# Patient Record
Sex: Female | Born: 1950 | Race: White | Hispanic: No | Marital: Married | Smoking: Never smoker
Health system: Southern US, Community
[De-identification: ages and names within clinical notes are randomized; demographics above are authoritative.]

## PROBLEM LIST (undated history)

## (undated) DIAGNOSIS — S060X9A Concussion with loss of consciousness of unspecified duration, initial encounter: Secondary | ICD-10-CM

## (undated) DIAGNOSIS — G43909 Migraine, unspecified, not intractable, without status migrainosus: Secondary | ICD-10-CM

## (undated) DIAGNOSIS — M797 Fibromyalgia: Secondary | ICD-10-CM

## (undated) DIAGNOSIS — S060XAA Concussion with loss of consciousness status unknown, initial encounter: Secondary | ICD-10-CM

## (undated) DIAGNOSIS — E079 Disorder of thyroid, unspecified: Secondary | ICD-10-CM

## (undated) HISTORY — DX: Concussion with loss of consciousness status unknown, initial encounter: S06.0XAA

## (undated) HISTORY — DX: Concussion with loss of consciousness of unspecified duration, initial encounter: S06.0X9A

---

## 2008-09-21 ENCOUNTER — Ambulatory Visit: Payer: Self-pay | Admitting: *Deleted

## 2009-01-03 ENCOUNTER — Ambulatory Visit: Payer: Self-pay | Admitting: Vascular Surgery

## 2009-02-22 ENCOUNTER — Emergency Department (HOSPITAL_BASED_OUTPATIENT_CLINIC_OR_DEPARTMENT_OTHER): Admission: EM | Admit: 2009-02-22 | Discharge: 2009-02-23 | Payer: Self-pay | Admitting: Emergency Medicine

## 2009-02-23 ENCOUNTER — Ambulatory Visit: Payer: Self-pay | Admitting: Diagnostic Radiology

## 2011-01-07 LAB — BASIC METABOLIC PANEL
BUN: 19 mg/dL (ref 6–23)
CO2: 31 mEq/L (ref 19–32)
Calcium: 9.4 mg/dL (ref 8.4–10.5)
Chloride: 103 mEq/L (ref 96–112)
Creatinine, Ser: 0.7 mg/dL (ref 0.4–1.2)
GFR calc Af Amer: >60 mL/min (ref >60)
GFR calc non Af Amer: >60 mL/min (ref >60)
Glucose, Bld: 81 mg/dL (ref 70–99)
Potassium: 3.8 mEq/L (ref 3.5–5.1)
Sodium: 143 mEq/L (ref 135–145)

## 2011-01-07 LAB — DIFFERENTIAL
Basophils Absolute: 0 10*3/uL (ref 0.0–0.1)
Basophils Relative: 1 % (ref 0–1)
Monocytes Absolute: 0.9 10*3/uL (ref 0.1–1.0)
Neutro Abs: 3.2 10*3/uL (ref 1.7–7.7)
Neutrophils Relative %: 41 % — ABNORMAL LOW (ref 43–77)

## 2011-01-07 LAB — CBC
MCHC: 35 g/dL (ref 30.0–36.0)
Platelets: 197 10*3/uL (ref 150–400)
RDW: 12.1 % (ref 11.5–15.5)

## 2011-02-11 NOTE — Procedures (Signed)
LOWER EXTREMITY VENOUS REFLUX EXAM   INDICATION:  Varicose veins with pain and swelling.   EXAM:  Using color-flow imaging and pulse Doppler spectral analysis, the  right and left common femoral, superficial femoral, popliteal, posterior  tibial, greater and lesser saphenous veins are evaluated.  There is  evidence suggesting deep venous insufficiency in the right and left  lower extremity common femoral veins.   The right saphenofemoral junction is not competent.  The right GSV is  not competent with the caliber as described below.  The left greater  saphenous vein is competent.   The right and left proximal short saphenous veins demonstrate  competency.   GSV Diameter (used if found to be incompetent only)                                            Right    Left  Proximal Greater Saphenous Vein           0.55 cm  cm  Proximal-to-mid-thigh                     cm       cm  Mid thigh                                 0.31 cm  cm  Mid-distal thigh                          cm       cm  Distal thigh                              0.45 cm  cm  Knee                                      0.35 cm  cm   IMPRESSION:  1. Right greater saphenous vein reflux is identified with the caliber      ranging from 0.31 cm to 0.55 cm knee to groin.  2. The right and left greater saphenous veins are not aneurysmal.  3. The right and left greater saphenous veins are not tortuous.  4. The deep venous system is not competent in the common femoral      veins.  5. The right and left lesser saphenous veins are competent.       ___________________________________________  Larina Earthly, M.D.   AS/MEDQ  D:  09/21/2008  T:  09/21/2008  Job:  [id number]

## 2011-02-11 NOTE — Assessment & Plan Note (Signed)
OFFICE VISIT   MIRIELLE, BYRUM  DOB:  Nov 01, 1950                                       01/03/2009  EAVWU#:[id number]   The patient presents today for continued discussion regarding her venous  pathology.  She is a very pleasant 60 year old white female who had  initially seen Dr. Madilyn Fireman for consultation regarding venous pathology on  09/21/2008.  She had several episodes of bleeding around small varices  around her ankle in November of 2009.  She also reported pain and  discomfort over tributary varicosities in her right calf.  She has  scattered spider vein telangiectasia bilaterally.  She does use  elevation when possible but has not worn graduated compression garments.  She has not had any further bleeding since November.  Her venous duplex  at the time of her initial visit on 09/21/2009 revealed incompetence in  her right great saphenous vein.  On physical exam, she does continue to  have varicosities in her calf and she does report tenderness overlying  these.  I discussed the options with Ms. Whitsitt.  I have recommended that  we fit her with graduated compression garments to see if we can improve  the symptoms of her pain and swelling.  I did explain the cause of her  bleeding from her ankle was due to the venous hypertension.  I did  discuss the options of laser ablation and stab phlebectomy for  improvement of symptoms as well.  She understands this and we will see  her again in 3 months for continued discussion.   Larina Earthly, M.D.  Electronically Signed   TFE/MEDQ  D:  01/03/2009  T:  01/04/2009  Job:  [id number]
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
## 2012-09-07 ENCOUNTER — Encounter (INDEPENDENT_AMBULATORY_CARE_PROVIDER_SITE_OTHER): Payer: Self-pay

## 2012-09-07 DIAGNOSIS — I83893 Varicose veins of bilateral lower extremities with other complications: Secondary | ICD-10-CM

## 2015-02-22 ENCOUNTER — Emergency Department (HOSPITAL_BASED_OUTPATIENT_CLINIC_OR_DEPARTMENT_OTHER)
Admission: EM | Admit: 2015-02-22 | Discharge: 2015-02-22 | Disposition: A | Discharge status: Home / Self Care | Payer: Medicare Other | Attending: Emergency Medicine | Admitting: Emergency Medicine

## 2015-02-22 ENCOUNTER — Encounter (HOSPITAL_BASED_OUTPATIENT_CLINIC_OR_DEPARTMENT_OTHER): Payer: Self-pay | Admitting: *Deleted

## 2015-02-22 ENCOUNTER — Emergency Department (HOSPITAL_BASED_OUTPATIENT_CLINIC_OR_DEPARTMENT_OTHER): Payer: Medicare Other

## 2015-02-22 DIAGNOSIS — S8001XA Contusion of right knee, initial encounter: Secondary | ICD-10-CM

## 2015-02-22 DIAGNOSIS — S161XXA Strain of muscle, fascia and tendon at neck level, initial encounter: Secondary | ICD-10-CM | POA: Diagnosis not present

## 2015-02-22 DIAGNOSIS — S79912A Unspecified injury of left hip, initial encounter: Secondary | ICD-10-CM | POA: Diagnosis not present

## 2015-02-22 DIAGNOSIS — S8002XA Contusion of left knee, initial encounter: Secondary | ICD-10-CM | POA: Diagnosis not present

## 2015-02-22 DIAGNOSIS — Y92410 Unspecified street and highway as the place of occurrence of the external cause: Secondary | ICD-10-CM | POA: Diagnosis not present

## 2015-02-22 DIAGNOSIS — Y998 Other external cause status: Secondary | ICD-10-CM | POA: Diagnosis not present

## 2015-02-22 DIAGNOSIS — S79911A Unspecified injury of right hip, initial encounter: Secondary | ICD-10-CM | POA: Insufficient documentation

## 2015-02-22 DIAGNOSIS — Y9389 Activity, other specified: Secondary | ICD-10-CM | POA: Insufficient documentation

## 2015-02-22 DIAGNOSIS — S0990XA Unspecified injury of head, initial encounter: Secondary | ICD-10-CM | POA: Diagnosis not present

## 2015-02-22 DIAGNOSIS — S8991XA Unspecified injury of right lower leg, initial encounter: Secondary | ICD-10-CM | POA: Diagnosis present

## 2015-02-22 HISTORY — DX: Fibromyalgia: M79.7

## 2015-02-22 HISTORY — DX: Disorder of thyroid, unspecified: E07.9

## 2015-02-22 HISTORY — DX: Migraine, unspecified, not intractable, without status migrainosus: G43.909

## 2015-02-22 MED ORDER — HYDROCODONE-ACETAMINOPHEN 5-325 MG PO TABS: 1.0000 | ORAL_TABLET | Freq: Four times a day (QID) | ORAL | Status: AC | PRN
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
## 2015-02-22 NOTE — Discharge Instructions (Signed)
Hydrocodone as prescribed as needed for pain.  Apply ice to the painful areas 20 minutes at a time every 2 hours while awake for the next 2 days.  Return to the ER if you experience any new or worsening symptoms.   Cervical Sprain A cervical sprain is an injury in the neck in which the strong, fibrous tissues (ligaments) that connect your neck bones stretch or tear. Cervical sprains can range from mild to severe. Severe cervical sprains can cause the neck vertebrae to be unstable. This can lead to damage of the spinal cord and can result in serious nervous system problems. The amount of time it takes for a cervical sprain to get better depends on the cause and extent of the injury. Most cervical sprains heal in 1 to 3 weeks. CAUSES  Severe cervical sprains may be caused by:   Contact sport injuries (such as from football, rugby, wrestling, hockey, auto racing, gymnastics, diving, martial arts, or boxing).   Motor vehicle collisions.   Whiplash injuries. This is an injury from a sudden forward and backward whipping movement of the head and neck.  Falls.  Mild cervical sprains may be caused by:   Being in an awkward position, such as while cradling a telephone between your ear and shoulder.   Sitting in a chair that does not offer proper support.   Working at a poorly Marketing executive station.   Looking up or down for long periods of time.  SYMPTOMS   Pain, soreness, stiffness, or a burning sensation in the front, back, or sides of the neck. This discomfort may develop immediately after the injury or slowly, 24 hours or more after the injury.   Pain or tenderness directly in the middle of the back of the neck.   Shoulder or upper back pain.   Limited ability to move the neck.   Headache.   Dizziness.   Weakness, numbness, or tingling in the hands or arms.   Muscle spasms.   Difficulty swallowing or chewing.   Tenderness and swelling of the neck.   DIAGNOSIS  Most of the time your health care provider can diagnose a cervical sprain by taking your history and doing a physical exam. Your health care provider will ask about previous neck injuries and any known neck problems, such as arthritis in the neck. X-rays may be taken to find out if there are any other problems, such as with the bones of the neck. Other tests, such as a CT scan or MRI, may also be needed.  TREATMENT  Treatment depends on the severity of the cervical sprain. Mild sprains can be treated with rest, keeping the neck in place (immobilization), and pain medicines. Severe cervical sprains are immediately immobilized. Further treatment is done to help with pain, muscle spasms, and other symptoms and may include:  Medicines, such as pain relievers, numbing medicines, or muscle relaxants.   Physical therapy. This may involve stretching exercises, strengthening exercises, and posture training. Exercises and improved posture can help stabilize the neck, strengthen muscles, and help stop symptoms from returning.  HOME CARE INSTRUCTIONS   Put ice on the injured area.   Put ice in a plastic bag.   Place a towel between your skin and the bag.   Leave the ice on for 15-20 minutes, 3-4 times a day.   If your injury was severe, you may have been given a cervical collar to wear. A cervical collar is a two-piece collar designed to keep your neck  from moving while it heals.  Do not remove the collar unless instructed by your health care provider.  If you have long hair, keep it outside of the collar.  Ask your health care provider before making any adjustments to your collar. Minor adjustments may be required over time to improve comfort and reduce pressure on your chin or on the back of your head.  Ifyou are allowed to remove the collar for cleaning or bathing, follow your health care provider's instructions on how to do so safely.  Keep your collar clean by wiping it with  mild soap and water and drying it completely. If the collar you have been given includes removable pads, remove them every 1-2 days and hand wash them with soap and water. Allow them to air dry. They should be completely dry before you wear them in the collar.  If you are allowed to remove the collar for cleaning and bathing, wash and dry the skin of your neck. Check your skin for irritation or sores. If you see any, tell your health care provider.  Do not drive while wearing the collar.   Only take over-the-counter or prescription medicines for pain, discomfort, or fever as directed by your health care provider.   Keep all follow-up appointments as directed by your health care provider.   Keep all physical therapy appointments as directed by your health care provider.   Make any needed adjustments to your workstation to promote good posture.   Avoid positions and activities that make your symptoms worse.   Warm up and stretch before being active to help prevent problems.  SEEK MEDICAL CARE IF:   Your pain is not controlled with medicine.   You are unable to decrease your pain medicine over time as planned.   Your activity level is not improving as expected.  SEEK IMMEDIATE MEDICAL CARE IF:   You develop any bleeding.  You develop stomach upset.  You have signs of an allergic reaction to your medicine.   Your symptoms get worse.   You develop new, unexplained symptoms.   You have numbness, tingling, weakness, or paralysis in any part of your body.  MAKE SURE YOU:   Understand these instructions.  Will watch your condition.  Will get help right away if you are not doing well or get worse. Document Released: 07/13/2007 Document Revised: 09/20/2013 Document Reviewed: 03/23/2013 [facility] Patient Information 2015 [place], [place]. This information is not intended to replace advice given to you by your health care provider. Make sure you discuss any questions you  have with your health care provider.  Contusion A contusion is a deep bruise. Contusions are the result of an injury that caused bleeding under the skin. The contusion may turn blue, purple, or yellow. Minor injuries will give you a painless contusion, but more severe contusions may stay painful and swollen for a few weeks.  CAUSES  A contusion is usually caused by a blow, trauma, or direct force to an area of the body. SYMPTOMS   Swelling and redness of the injured area.  Bruising of the injured area.  Tenderness and soreness of the injured area.  Pain. DIAGNOSIS  The diagnosis can be made by taking a history and physical exam. An X-ray, CT scan, or MRI may be needed to determine if there were any associated injuries, such as fractures. TREATMENT  Specific treatment will depend on what area of the body was injured. In general, the best treatment for a contusion is resting,  icing, elevating, and applying cold compresses to the injured area. Over-the-counter medicines may also be recommended for pain control. Ask your caregiver what the best treatment is for your contusion. HOME CARE INSTRUCTIONS   Put ice on the injured area.  Put ice in a plastic bag.  Place a towel between your skin and the bag.  Leave the ice on for 15-20 minutes, 3-4 times a day, or as directed by your health care provider.  Only take over-the-counter or prescription medicines for pain, discomfort, or fever as directed by your caregiver. Your caregiver may recommend avoiding anti-inflammatory medicines (aspirin, ibuprofen, and naproxen) for 48 hours because these medicines may increase bruising.  Rest the injured area.  If possible, elevate the injured area to reduce swelling. SEEK IMMEDIATE MEDICAL CARE IF:   You have increased bruising or swelling.  You have pain that is getting worse.  Your swelling or pain is not relieved with medicines. MAKE SURE YOU:   Understand these instructions.  Will watch  your condition.  Will get help right away if you are not doing well or get worse. Document Released: 06/25/2005 Document Revised: 09/20/2013 Document Reviewed: 07/21/2011 [facility] Patient Information 2015 [place], [place]. This information is not intended to replace advice given to you by your health care provider. Make sure you discuss any questions you have with your health care provider.  Motor Vehicle Collision It is common to have multiple bruises and sore muscles after a motor vehicle collision (MVC). These tend to feel worse for the first 24 hours. You may have the most stiffness and soreness over the first several hours. You may also feel worse when you wake up the first morning after your collision. After this point, you will usually begin to improve with each day. The speed of improvement often depends on the severity of the collision, the number of injuries, and the location and nature of these injuries. HOME CARE INSTRUCTIONS  Put ice on the injured area.  Put ice in a plastic bag.  Place a towel between your skin and the bag.  Leave the ice on for 15-20 minutes, 3-4 times a day, or as directed by your health care provider.  Drink enough fluids to keep your urine clear or pale yellow. Do not drink alcohol.  Take a warm shower or bath once or twice a day. This will increase blood flow to sore muscles.  You may return to activities as directed by your caregiver. Be careful when lifting, as this may aggravate neck or back pain.  Only take over-the-counter or prescription medicines for pain, discomfort, or fever as directed by your caregiver. Do not use aspirin. This may increase bruising and bleeding. SEEK IMMEDIATE MEDICAL CARE IF:  You have numbness, tingling, or weakness in the arms or legs.  You develop severe headaches not relieved with medicine.  You have severe neck pain, especially tenderness in the middle of the back of your neck.  You have changes in bowel or  bladder control.  There is increasing pain in any area of the body.  You have shortness of breath, light-headedness, dizziness, or fainting.  You have chest pain.  You feel sick to your stomach (nauseous), throw up (vomit), or sweat.  You have increasing abdominal discomfort.  There is blood in your urine, stool, or vomit.  You have pain in your shoulder (shoulder strap areas).  You feel your symptoms are getting worse. MAKE SURE YOU:  Understand these instructions.  Will watch your condition.  Will get help right away if you are not doing well or get worse. Document Released: 09/15/2005 Document Revised: 01/30/2014 Document Reviewed: 02/12/2011 [facility] Patient Information 2015 [place]. This information is not intended to replace advice given to you by your health care provider. Make sure you discuss any questions you have with your health care provider.
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
## 2015-02-22 NOTE — ED Notes (Signed)
Pt in xray

## 2016-02-26 ENCOUNTER — Ambulatory Visit: Payer: Medicare Other | Attending: Sports Medicine | Admitting: Physical Therapy

## 2016-02-26 DIAGNOSIS — M545 Low back pain, unspecified: Secondary | ICD-10-CM

## 2016-02-26 DIAGNOSIS — M25512 Pain in left shoulder: Secondary | ICD-10-CM | POA: Insufficient documentation

## 2016-02-26 DIAGNOSIS — R293 Abnormal posture: Secondary | ICD-10-CM | POA: Diagnosis present

## 2016-02-26 DIAGNOSIS — M6281 Muscle weakness (generalized): Secondary | ICD-10-CM | POA: Insufficient documentation

## 2016-02-26 DIAGNOSIS — M25612 Stiffness of left shoulder, not elsewhere classified: Secondary | ICD-10-CM | POA: Insufficient documentation
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
## 2016-02-27 ENCOUNTER — Ambulatory Visit: Payer: Medicare Other

## 2016-02-27 DIAGNOSIS — M545 Low back pain, unspecified: Secondary | ICD-10-CM

## 2016-02-27 DIAGNOSIS — M25612 Stiffness of left shoulder, not elsewhere classified: Secondary | ICD-10-CM

## 2016-02-27 DIAGNOSIS — R293 Abnormal posture: Secondary | ICD-10-CM | POA: Diagnosis not present

## 2016-02-27 DIAGNOSIS — M6281 Muscle weakness (generalized): Secondary | ICD-10-CM

## 2016-02-27 DIAGNOSIS — M25512 Pain in left shoulder: Secondary | ICD-10-CM
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
## 2016-03-02 ENCOUNTER — Emergency Department (HOSPITAL_BASED_OUTPATIENT_CLINIC_OR_DEPARTMENT_OTHER)
Admission: EM | Admit: 2016-03-02 | Discharge: 2016-03-02 | Disposition: A | Discharge status: Home / Self Care | Payer: Medicare Other | Attending: Emergency Medicine | Admitting: Emergency Medicine

## 2016-03-02 ENCOUNTER — Encounter (HOSPITAL_BASED_OUTPATIENT_CLINIC_OR_DEPARTMENT_OTHER): Payer: Self-pay | Admitting: Emergency Medicine

## 2016-03-02 DIAGNOSIS — R21 Rash and other nonspecific skin eruption: Secondary | ICD-10-CM | POA: Diagnosis not present

## 2016-03-02 MED ORDER — LEVOTHYROXINE SODIUM 100 MCG PO TABS: 100.0000 ug | ORAL_TABLET | Freq: Every day | ORAL | Status: AC

## 2016-03-02 MED ORDER — DIPHENHYDRAMINE HCL 25 MG PO TABS: 25.0000 mg | ORAL_TABLET | Freq: Four times a day (QID) | ORAL | Status: AC | PRN

## 2016-03-02 MED ORDER — HYDROCORTISONE 2.5 % EX LOTN: TOPICAL_LOTION | Freq: Two times a day (BID) | CUTANEOUS | Status: AC

## 2016-03-02 MED ORDER — CETIRIZINE HCL 10 MG PO TABS: 10.0000 mg | ORAL_TABLET | Freq: Every day | ORAL | Status: AC

## 2016-03-02 NOTE — Discharge Instructions (Signed)
Insect Bite Mosquitoes, flies, fleas, bedbugs, and many other insects can bite. Insect bites are different from insect stings. A sting is when poison (venom) is injected into the skin. Insect bites can cause pain or itching for a few days, but they are usually not serious. Some insects can spread diseases to people through a bite. SYMPTOMS  Symptoms of an insect bite include:  Itching or pain in the bite area.  Redness and swelling in the bite area.  An open wound (skin ulcer). In many cases, symptoms last for 2-4 days.  DIAGNOSIS  This condition is usually diagnosed based on symptoms and a physical exam. TREATMENT  Treatment is usually not needed for an insect bite. Symptoms often go away on their own. Your health care provider may recommend creams or lotions to help reduce itching. Antibiotic medicines may be prescribed if the bite becomes infected. A tetanus shot may be given in some cases. If you develop an allergic reaction to an insect bite, your health care provider will prescribe medicines to treat the reaction (antihistamines). This is rare. HOME CARE INSTRUCTIONS  Do not scratch the bite area.  Keep the bite area clean and dry. Wash the bite area daily with soap and water as told by your health care provider.  If directed, applyice to the bite area.  Put ice in a plastic bag.  Place a towel between your skin and the bag.  Leave the ice on for 20 minutes, 2-3 times per day.  To help reduce itching and swelling, try applying a baking soda paste, cortisone cream, or calamine lotion to the bite area as told by your health care provider.  Apply or take over-the-counter and prescription medicines only as told by your health care provider.  If you were prescribed an antibiotic medicine, use it as told by your health care provider. Do not stop using the antibiotic even if your condition improves.  Keep all follow-up visits as told by your health care provider. This is  important. PREVENTION   Use insect repellent. The best insect repellents contain:  DEET, picaridin, oil of lemon eucalyptus (OLE), or IR3535.  Higher amounts of an active ingredient.  When you are outdoors, wear clothing that covers your arms and legs.  Avoid opening windows that do not have window screens. SEEK MEDICAL CARE IF:  You have increased redness, swelling, or pain in the bite area.  You have a fever. SEEK IMMEDIATE MEDICAL CARE IF:   You have joint pain.   You have fluid, blood, or pus coming from the bite area.  You have a headache or neck pain.  You have unusual weakness.  You have a rash.  You have chest pain or shortness of breath.  You have abdominal pain, nausea, or vomiting.  You feel unusually tired or sleepy.   This information is not intended to replace advice given to you by your health care provider. Make sure you discuss any questions you have with your health care provider.   Document Released: 10/23/2004 Document Revised: 06/06/2015 Document Reviewed: 01/31/2015 Elsevier Interactive Patient Education 2016 Elsevier Inc[employer] Rash A rash is a change in the color or texture of the skin. There are many different types of rashes. You may have other problems that accompany your rash. CAUSES   Infections.  Allergic reactions. This can include allergies to pets or foods.  Certain medicines.  Exposure to certain chemicals, soaps, or cosmetics.  Heat.  Exposure to poisonous plants.  Tumors, both cancerous and  noncancerous. SYMPTOMS   Redness.  Scaly skin.  Itchy skin.  Dry or cracked skin.  Bumps.  Blisters.  Pain. DIAGNOSIS  Your caregiver may do a physical exam to determine what type of rash you have. A skin sample (biopsy) may be taken and examined under a microscope. TREATMENT  Treatment depends on the type of rash you have. Your caregiver may prescribe certain medicines. For serious conditions, you may need to see a skin  doctor (dermatologist). HOME CARE INSTRUCTIONS   Avoid the substance that caused your rash.  Do not scratch your rash. This can cause infection.  You may take cool baths to help stop itching.  Only take over-the-counter or prescription medicines as directed by your caregiver.  Keep all follow-up appointments as directed by your caregiver. SEEK IMMEDIATE MEDICAL CARE IF:  You have increasing pain, swelling, or redness.  You have a fever.  You have new or severe symptoms.  You have body aches, diarrhea, or vomiting.  Your rash is not better after 3 days. MAKE SURE YOU:  Understand these instructions.  Will watch your condition.  Will get help right away if you are not doing well or get worse.   This information is not intended to replace advice given to you by your health care provider. Make sure you discuss any questions you have with your health care provider.   Document Released: 09/05/2002 Document Revised: 10/06/2014 Document Reviewed: 01/31/2015 Elsevier Interactive Patient Education [employer]

## 2016-03-02 NOTE — ED Notes (Signed)
Pt c/o itchy red rash since Thursday. Pt has red raised areas on her neck, L leg and back. Pt has been using cortisone cream without relief.

## 2016-03-02 NOTE — ED Provider Notes (Signed)
CSN: [id number]     Arrival date & time 03/02/16  1039 History   First MD Initiated Contact with Patient 03/02/16 1049     Chief Complaint  Patient presents with  . Rash    Vickie Jefferson is a 65 y.o. female who presents to the ED complaining of a red itchy rash to her neck, and back of her leg for 3 days. She reports they are very itchy and the itching has spread to the left side of her neck now as well. She was outside at her mothers house and saw spiders, but has seen no insects on her. She has used steroid cream, alcohol and hydrogen peroxide to treat her symptoms with little relief. She denies any pain but only itching. No discharge from the sites. She also reports associated sneezing, and runny nose. She denies any changes to her soaps, lotions, perfumes, detergents, plants or animals in the home. No insects seen on her body. No sick contacts. Patient reports she ran out of her Synthroid yesterday and would like a refill. She reports she cannot see her primary care doctor until next week. The patient denies fevers, chills, body aches, abdominal pain, nausea, vomiting, painful rashes, neck pain, tongue swelling, lip swelling, trouble swallowing, chest pain, shortness of breath.    Patient is a 65 y.o. female presenting with rash. The history is provided by the patient. No language interpreter was used.  Rash Associated symptoms: no abdominal pain, no diarrhea, no fever, no headaches, no joint pain, no myalgias, no nausea, no shortness of breath, no sore throat and not vomiting     Past Medical History  Diagnosis Date  . Thyroid disease   . Migraines   . Fibromyalgia    History reviewed. No pertinent past surgical history. No family history on file. Social History  Substance Use Topics  . Smoking status: Never Smoker   . Smokeless tobacco: None  . Alcohol Use: Yes   OB History    No data available     Review of Systems  Constitutional: Negative for fever and chills.  HENT: Positive  for postnasal drip, rhinorrhea and sneezing. Negative for congestion, facial swelling, sore throat and trouble swallowing.   Eyes: Negative for visual disturbance.  Respiratory: Negative for cough and shortness of breath.   Cardiovascular: Negative for chest pain.  Gastrointestinal: Negative for nausea, vomiting, abdominal pain and diarrhea.  Genitourinary: Negative for dysuria.  Musculoskeletal: Negative for myalgias, back pain, arthralgias, neck pain and neck stiffness.  Skin: Positive for rash. Negative for pallor.  Neurological: Negative for headaches.      Allergies  Betadine; Cephalexin; Ciprofloxacin; and Tetracyclines & related  Home Medications   Prior to Admission medications   Medication Sig Start Date End Date Taking? Authorizing Provider  cetirizine (ZYRTEC ALLERGY) 10 MG tablet Take 1 tablet (10 mg total) by mouth daily. 03/02/16   Everlene Farrier, PA-C  diphenhydrAMINE (BENADRYL) 25 MG tablet Take 1 tablet (25 mg total) by mouth every 6 (six) hours as needed for itching (Rash). 03/02/16   Everlene Farrier, PA-C  HYDROcodone-acetaminophen (NORCO) 5-325 MG per tablet Take 1-2 tablets by mouth every 6 (six) hours as needed. 02/22/15   Geoffery Lyons, MD  hydrocortisone 2.5 % lotion Apply topically 2 (two) times daily. 03/02/16   Everlene Farrier, PA-C  levothyroxine (SYNTHROID, LEVOTHROID) 100 MCG tablet Take 1 tablet (100 mcg total) by mouth daily before breakfast. 03/02/16   Everlene Farrier, PA-C  thyroid (ARMOUR) 60 MG tablet Take 60  mg by mouth 3 (three) times daily. Reported on 02/26/2016    Historical Provider, MD   BP 142/72 mmHg  Pulse 62  Temp(Src) 97.9 F (36.6 C) (Oral)  Resp 18  Ht 5\' 6"  (1.676 m)  Wt 80.74 kg  BMI 28.74 kg/m2  SpO2 99% Physical Exam  Constitutional: She appears well-developed and well-nourished. No distress.  Nontoxic-appearing.  HENT:  Head: Normocephalic and atraumatic.  Mouth/Throat: Oropharynx is clear and moist.  Throat is clear. No tongue or lip  swelling.  Eyes: Conjunctivae are normal. Pupils are equal, round, and reactive to light. Right eye exhibits no discharge. Left eye exhibits no discharge.  Neck: Normal range of motion. Neck supple.  Cardiovascular: Normal rate, regular rhythm, normal heart sounds and intact distal pulses.   Pulmonary/Chest: Effort normal and breath sounds normal. No stridor. No respiratory distress. She has no wheezes. She has no rales.  Lungs are clear to auscultation bilaterally.  Abdominal: Soft. There is no tenderness.  Musculoskeletal: She exhibits no edema.  Lymphadenopathy:    She has no cervical adenopathy.  Neurological: She is alert. Coordination normal.  Skin: Skin is warm and dry. No rash noted. She is not diaphoretic. No pallor.  Scattered small erythematous and raised lesions less than 0.5 cm to her left lateral neck and behind her left upper thigh. No lesions between her web spaces. No burrows. No vesicles or bulla. No discharge. No surrounding erythema or evidence of cellulitis. No target lesions.   Psychiatric: She has a normal mood and affect. Her behavior is normal.  Nursing note and vitals reviewed.   ED Course  Procedures (including critical care time) Labs Review Labs Reviewed - No data to display  Imaging Review No results found.    EKG Interpretation None      Filed Vitals:   03/02/16 1043  BP: 142/72  Pulse: 62  Temp: 97.9 F (36.6 C)  TempSrc: Oral  Resp: 18  Height: 5\' 6"  (1.676 m)  Weight: 80.74 kg  SpO2: 99%     MDM   Meds given in ED:  Medications - No data to display  New Prescriptions   CETIRIZINE (ZYRTEC ALLERGY) 10 MG TABLET    Take 1 tablet (10 mg total) by mouth daily.   DIPHENHYDRAMINE (BENADRYL) 25 MG TABLET    Take 1 tablet (25 mg total) by mouth every 6 (six) hours as needed for itching (Rash).   HYDROCORTISONE 2.5 % LOTION    Apply topically 2 (two) times daily.    Final diagnoses:  Rash and nonspecific skin eruption   This is a 65 y.o.  female who presents to the ED complaining of a red itchy rash to her neck, and back of her leg for 3 days. She reports they are very itchy and the itching has spread to the left side of her neck now as well. She was outside at her mothers house and saw spiders, but has seen no insects on her. She has used steroid cream, alcohol and hydrogen peroxide to treat her symptoms with little relief. She denies any pain but only itching. No discharge from the sites. She denies any changes to her sats, lotions, perfumes, detergents, plants or animals in the home. No insects seen on her body. No sick contacts. Patient reports she ran out of her Synthroid yesterday and would like a refill. She reports she cannot see her primary care doctor until next week. She denies fevers or body aches. On exam the patient is afebrile  nontoxic-appearing. Patient has several small scattered raised and erythematous lesions that are less than 0.5 cm in size to her left lateral neck and behind her left upper thigh. No surrounding erythema. No discharge. No abscesses. No vesicles or bulla. No lesions between her web spaces. No evidence of scabies or cellulitis. No evidence of shingles. The rash is not painful to touch. They appear to be insect bites. We will discharge patient prescriptions for Zyrtec, Benadryl and hydrocortisone lotion. I encouraged her to observe these lesions closely and if they continue to spread or worsen or she has new or worsening symptoms she needs to return for reevaluation. I encouraged her to follow-up with primary care this week for recheck. I advised the patient to follow-up with their primary care provider this week. I advised the patient to return to the emergency department with new or worsening symptoms or new concerns. The patient verbalized understanding and agreement with plan.       Everlene FarrierWilliam Dayzee Trower, PA-C 03/02/16 1151  Lavera Guiseana Duo Liu, MD 03/03/16 [id number]

## 2016-03-11 ENCOUNTER — Ambulatory Visit: Payer: Medicare Other | Attending: Sports Medicine

## 2016-03-11 DIAGNOSIS — M6281 Muscle weakness (generalized): Secondary | ICD-10-CM

## 2016-03-11 DIAGNOSIS — M545 Low back pain, unspecified: Secondary | ICD-10-CM

## 2016-03-11 DIAGNOSIS — M25612 Stiffness of left shoulder, not elsewhere classified: Secondary | ICD-10-CM | POA: Diagnosis present

## 2016-03-11 DIAGNOSIS — M25512 Pain in left shoulder: Secondary | ICD-10-CM | POA: Diagnosis present

## 2016-03-11 DIAGNOSIS — R293 Abnormal posture: Secondary | ICD-10-CM

## 2016-03-11 NOTE — Therapy (Signed)
[facility] [place] [address]  [address] [place], [place], [address] Phone: [phone]   Fax:  [phone]  Physical Therapy Treatment  Patient Details  Name: Vickie FellsMarcy Jefferson MRN: [id number] Date of Birth: 04/25/1951 Referring Provider: Delfin GantAdam S Jefferson  Encounter Date: 03/11/2016      PT End of Session - 03/11/16 1508    Visit Number 3   Number of Visits 16   Date for PT Re-Evaluation 04/22/16   PT Start Time 1458   PT Stop Time 1537   PT Time Calculation (min) 39 min   Activity Tolerance Patient tolerated treatment well;Patient limited by pain   Behavior During Therapy [facility] for tasks assessed/performed      Past Medical History  Diagnosis Date  . Thyroid disease   . Migraines   . Fibromyalgia     History reviewed. No pertinent past surgical history.  There were no vitals filed for this visit.      Subjective Assessment - 03/11/16 1509    Subjective Pt. reports L shoulder and low back is pain free today.     Patient Stated Goals improve posture, pain   Currently in Pain? No/denies   Pain Score 0-No pain   Multiple Pain Sites No          Today's Treatment:  Therex: B HS, piriformis, glute, SKTC stretch x 30 sec each way; pt. with very limited ROM tolerance and report of pain with guarding in all motions L shoulder AROM flexion, abduction: terminated due to pt. report of pain * 6/10 L medial scapular border shoulder pain reported following L shoulder AROM flexion   Therex:  Hooklying bridge x 10 reps  Hooklying bridge with alternating hip abd/ER with blue TB x 10 reps each  B sidelying clam shell with blue TB x 10 reps each side  Manual:  Seated L UT TPR x 10 min; significant trigger poinst along inferior ankle and superior to spine of scapula          PT Short Term Goals - 02/27/16 1242    PT SHORT TERM GOAL #1   Title independent with HEP (03/25/16)   Time 4   Period Weeks   Status On-going   PT  SHORT TERM GOAL #2   Title improve L shoulder AROM by 15 degrees for improved mobility and function (03/25/16)   Time 4   Period Weeks   Status On-going   PT SHORT TERM GOAL #3   Title improve L grip strength by 5# for improved function (03/25/16)   Time 4   Period Weeks   Status On-going           PT Long Term Goals - 02/27/16 1242    PT LONG TERM GOAL #1   Title verbalize understanding of posture/body mechanics to decrease risk of reinjury (04/22/16)   Time 8   Period Weeks   Status On-going   PT LONG TERM GOAL #2   Title improve L shoulder AROM to WNL for improved function and mobility (04/22/16)   Time 8   Period Weeks   Status On-going   PT LONG TERM GOAL #3   Title improve LLE strength to 4/5 for improved function and mobiilty (04/22/16)   Time 8   Period Weeks   Status On-going   PT LONG TERM GOAL #4   Title improve L grip strength to 8# for improved function (04/22/16)   Time 8   Period Weeks  Status On-going   PT LONG TERM GOAL #5   Title report ability to amb > 20 min without increase in pain for improved function (04/22/16)   Time 8   Period Weeks   Status On-going               Plan - 03/11/16 1508    Clinical Impression Statement Pt. reports L shoulder and low back as pain free today.  Today's treatment focused on lumbopelvic strengthening and TPR to L UT; pt. with limited tolerance for therex due to pain; pt. reported L shoulder pain as a 6/10 following L shoulder AROM measurements; pt. movements at L shoulder throughout treatment was unguarded and WFL; pt. movements at low back and L shoulder did not mimic pain report today.  L shoulder AROM: flexion 102 d, 138 dg abd today.         PT Treatment/Interventions ADLs/Self Care Home Management;Cryotherapy;Electrical Stimulation;Iontophoresis /ml Dexamethasone;Moist Heat;Patient/family education;Neuromuscular re-education;Therapeutic exercise;Therapeutic activities;Functional mobility training;Stair  training;Gait training;Vasopneumatic Device;Passive range of motion;Dry needling;Taping   PT Next Visit Plan posture education and exercises; focus on neck/shoulder ROM and scap strengthening - progress to lumbar exercises as tolerated; estim/TENS      Patient will benefit from skilled therapeutic intervention in order to improve the following deficits and impairments:  Decreased strength, Decreased range of motion, Pain, Impaired perceived functional ability, Postural dysfunction, Decreased activity tolerance, Decreased mobility, Impaired UE functional use  Visit Diagnosis: Abnormal posture  Muscle weakness (generalized)  Left-sided low back pain without sciatica  Pain in left shoulder  Stiffness of left shoulder, not elsewhere classified     Problem List There are no active problems to display for this patient.   Kermit Balo, PTA 03/12/2016, 3:39 PM  [facility] [address]  [address] [place], [place], [address] Phone: [phone]   Fax:  [phone]  Name: Vickie Jefferson MRN: [id number] Date of Birth: 1951/06/22

## 2016-03-14 ENCOUNTER — Ambulatory Visit: Payer: Medicare Other | Admitting: Physical Therapy

## 2016-03-14 ENCOUNTER — Other Ambulatory Visit (HOSPITAL_BASED_OUTPATIENT_CLINIC_OR_DEPARTMENT_OTHER): Payer: Self-pay | Admitting: Family Medicine

## 2016-03-14 DIAGNOSIS — M25512 Pain in left shoulder: Secondary | ICD-10-CM

## 2016-03-14 DIAGNOSIS — R293 Abnormal posture: Secondary | ICD-10-CM

## 2016-03-14 DIAGNOSIS — M6281 Muscle weakness (generalized): Secondary | ICD-10-CM

## 2016-03-14 DIAGNOSIS — M545 Low back pain, unspecified: Secondary | ICD-10-CM

## 2016-03-14 DIAGNOSIS — Z1231 Encounter for screening mammogram for malignant neoplasm of breast: Secondary | ICD-10-CM

## 2016-03-14 DIAGNOSIS — M25612 Stiffness of left shoulder, not elsewhere classified: Secondary | ICD-10-CM

## 2016-03-14 NOTE — Patient Instructions (Signed)
Resisted Horizontal Abduction: Bilateral    Sit or stand, tubing in both hands, arms out in front. Keeping arms straight, pinch shoulder blades together and stretch arms out. Repeat __10__ times per set. Do __1__ sets per session. Do __1-2__ sessions per day.  http://orth.exer.us/969   Copyright  VHI. All rights reserved.    Resisted External Rotation: in Neutral - Bilateral    Sit or stand, tubing in both hands, elbows at sides, bent to 90, forearms forward. Pinch shoulder blades together and rotate forearms out. Keep elbows at sides. Repeat __10__ times per set. Do __1__ sets per session. Do _1-2___ sessions per day.  http://orth.exer.us/967   Copyright  VHI. All rights reserved.    Scapular Retraction: Rowing (Eccentric) - Arms - Side (Resistance Band)    Hold end of band in each hand. Pull back until elbows are even with trunk. Keep elbows by sides, thumbs up. Slowly release for 3-5 seconds. Use ___yellow_____ resistance band. _10__ reps per set, _1-2__ sets per day.   http://ecce.exer.us/227   Copyright  VHI. All rights reserved.

## 2016-03-14 NOTE — Therapy (Signed)
[facility] [place] [address]  [address] [place], [place], [address] Phone: [phone]   Fax:  [phone]  Physical Therapy Treatment  Patient Details  Name: Vickie FellsMarcy Fenner MRN: [id number] Date of Birth: 02/13/1951 Referring Provider: Delfin GantAdam S Kendall  Encounter Date: 03/14/2016      PT End of Session - 03/14/16 1151    Visit Number 4   Number of Visits 16   Date for PT Re-Evaluation 04/22/16   PT Start Time 1105   PT Stop Time 1144   PT Time Calculation (min) 39 min   Activity Tolerance Patient limited by pain   Behavior During Therapy [facility] for tasks assessed/performed      Past Medical History  Diagnosis Date  . Thyroid disease   . Migraines   . Fibromyalgia     No past surgical history on file.  There were no vitals filed for this visit.      Subjective Assessment - 03/14/16 1109    Subjective No pain in arms - "they're good today."  Pt reports "stretch [name] did the other day aggravated my sciatica."  Pt requested no lower extremity exercises today.   Patient Stated Goals improve posture, pain   Currently in Pain? Yes   Pain Score 8   "8.5"   Pain Location Back   Pain Orientation Right   Pain Type Acute pain   Pain Radiating Towards RLE to distal knee - "sometimes it's in the front and sometimes it just gives out"   Pain Onset In the past 7 days  Tuesday   Aggravating Factors  feels it was aggravated by specific stretch in PT   Pain Relieving Factors medication                         [facility] Adult PT Treatment/Exercise - 03/14/16 1112    Exercises   Exercises Neck;Shoulder   Neck Exercises: Machines for Strengthening   UBE (Upper Arm Bike) L 1 x 4 min; 2' fwd/2' bwd   Neck Exercises: Seated   Other Seated Exercise scapular retraction with yellow theraband x 10 with mod cues for technique   Neck Exercises: Supine   Shoulder ABduction Both;10 reps   Shoulder Abduction Limitations with yellow  theraband   Upper Extremity D1 Extension;10 reps;Theraband   Theraband Level (UE D1) Level 1 (Yellow)   Upper Extremity D2 Extension;10 reps;Theraband   Theraband Level (UE D2) Level 1 (Yellow)   UE D2 Limitations performed sitting due to pain in R back   Other Supine Exercise bil shoulder external rotation x 10 with yellow theraband; mod cues for technique   Shoulder Exercises: Therapy Ball   Flexion 5 reps   Flexion Limitations ball on wall with 5 sec hold; increase in L shoulder pain but pt able to hold LUE independently in ~ 115 degrees flexion upon taking hand off ball   Neck Exercises: Stretches   Other Neck Stretches supine on towel (upper thoracic only) x 2 min                PT Education - 03/14/16 1151    Education provided Yes   Education Details HEP - educated to perform to tolerance   Person(s) Educated Patient   Methods Explanation;Demonstration;Handout   Comprehension Verbalized understanding;Returned demonstration;Need further instruction          PT Short Term Goals - 02/27/16 1242    PT SHORT TERM GOAL #1  Title independent with HEP (03/25/16)   Time 4   Period Weeks   Status On-going   PT SHORT TERM GOAL #2   Title improve L shoulder AROM by 15 degrees for improved mobility and function (03/25/16)   Time 4   Period Weeks   Status On-going   PT SHORT TERM GOAL #3   Title improve L grip strength by 5# for improved function (03/25/16)   Time 4   Period Weeks   Status On-going           PT Long Term Goals - 02/27/16 1242    PT LONG TERM GOAL #1   Title verbalize understanding of posture/body mechanics to decrease risk of reinjury (04/22/16)   Time 8   Period Weeks   Status On-going   PT LONG TERM GOAL #2   Title improve L shoulder AROM to WNL for improved function and mobility (04/22/16)   Time 8   Period Weeks   Status On-going   PT LONG TERM GOAL #3   Title improve LLE strength to 4/5 for improved function and mobiilty (04/22/16)   Time  8   Period Weeks   Status On-going   PT LONG TERM GOAL #4   Title improve L grip strength to 8# for improved function (04/22/16)   Time 8   Period Weeks   Status On-going   PT LONG TERM GOAL #5   Title report ability to amb > 20 min without increase in pain for improved function (04/22/16)   Time 8   Period Weeks   Status On-going               Plan - 03/14/16 1152    Clinical Impression Statement Pt requested no lower body treatment today due to pt report of new onset R sciatic pain which she feels was caused by stretches during last session.  Pt stated "I knew I should have told him to stop but I didn't."  Educated with pt need to tell therapists response to interventions.  Pt verbalized understanding.  Pt also with very limited tolerance to exercises needing frequent rest breaks or modifications to treatment.  Pt unable to tolerate supine exercises so moved to sitting and then was unable to tolerate seated mid thoracic stretch with physioball so moved to standing.  Standing ball on wall  shoulder flexion stretch exacerbated L shoulder so only 5 reps performed.  Pt also requesting shoulder exercises to only be performed up to 90 degrees of flexion (perfroming horizontal abdct/addct and stated "can we only do exercises in this range?") and explained to pt that tx options would be limited but we can modify as able.  Will conitnue to benefit from PT to improve function and pain as pt allows.  Pt also demonstrated ability to grip yellow theraband without difficulty today.   PT Next Visit Plan try mid range shoulder exercises; posture education and exercises; focus on neck/shoulder ROM and scap strengthening - progress to lumbar exercises as tolerated; estim/TENS   Consulted and Agree with Plan of Care Patient      Patient will benefit from skilled therapeutic intervention in order to improve the following deficits and impairments:     Visit Diagnosis: Abnormal posture  Muscle weakness  (generalized)  Left-sided low back pain without sciatica  Pain in left shoulder  Stiffness of left shoulder, not elsewhere classified     Problem List There are no active problems to display for this patient.  Clarita Crane,  PT, DPT 03/14/2016 11:59 AM  [facility] [address]  [address] [place], [place], [address] Phone: [phone]   Fax:  [phone]  Name: England Greb MRN: [id number] Date of Birth: 1951/09/02

## 2016-03-18 ENCOUNTER — Ambulatory Visit: Payer: Medicare Other

## 2016-03-19 ENCOUNTER — Other Ambulatory Visit (HOSPITAL_BASED_OUTPATIENT_CLINIC_OR_DEPARTMENT_OTHER): Payer: Self-pay | Admitting: Family Medicine

## 2016-03-19 DIAGNOSIS — Z780 Asymptomatic menopausal state: Secondary | ICD-10-CM

## 2016-03-21 ENCOUNTER — Ambulatory Visit: Payer: Medicare Other

## 2016-03-25 ENCOUNTER — Ambulatory Visit (HOSPITAL_BASED_OUTPATIENT_CLINIC_OR_DEPARTMENT_OTHER)
Admission: RE | Admit: 2016-03-25 | Discharge: 2016-03-25 | Disposition: A | Discharge status: Home / Self Care | Payer: Medicare Other | Source: Ambulatory Visit | Attending: Family Medicine | Admitting: Family Medicine

## 2016-03-25 ENCOUNTER — Ambulatory Visit: Payer: Medicare Other

## 2016-03-25 DIAGNOSIS — R293 Abnormal posture: Secondary | ICD-10-CM | POA: Diagnosis not present

## 2016-03-25 DIAGNOSIS — Z780 Asymptomatic menopausal state: Secondary | ICD-10-CM | POA: Insufficient documentation

## 2016-03-25 DIAGNOSIS — M545 Low back pain, unspecified: Secondary | ICD-10-CM

## 2016-03-25 DIAGNOSIS — M85851 Other specified disorders of bone density and structure, right thigh: Secondary | ICD-10-CM | POA: Insufficient documentation

## 2016-03-25 DIAGNOSIS — Z1231 Encounter for screening mammogram for malignant neoplasm of breast: Secondary | ICD-10-CM | POA: Diagnosis not present

## 2016-03-25 DIAGNOSIS — M6281 Muscle weakness (generalized): Secondary | ICD-10-CM

## 2016-03-25 DIAGNOSIS — M25512 Pain in left shoulder: Secondary | ICD-10-CM

## 2016-03-25 DIAGNOSIS — M25612 Stiffness of left shoulder, not elsewhere classified: Secondary | ICD-10-CM

## 2016-03-25 NOTE — Therapy (Signed)
[facility] [place] [address]  [address] [place], [place], [address] Phone: [phone]   Fax:  [phone]  Physical Therapy Treatment  Patient Details  Name: Vickie Jefferson MRN: [id number] Date of Birth: 10/19/1950 Referring Provider: Delfin GantAdam S Kendall  Encounter Date: 03/25/2016      PT End of Session - 03/25/16 1513    Visit Number 5   Number of Visits 16   Date for PT Re-Evaluation 04/22/16   PT Start Time 1455   PT Stop Time 1534   PT Time Calculation (min) 39 min   Activity Tolerance Patient limited by pain   Behavior During Therapy [facility] for tasks assessed/performed      Past Medical History  Diagnosis Date  . Thyroid disease   . Migraines   . Fibromyalgia     History reviewed. No pertinent past surgical history.  There were no vitals filed for this visit.      Subjective Assessment - 03/25/16 1502    Subjective Pt. pain free in the low back today however with 2/10 L shoulder.     Patient Stated Goals improve posture, pain   Currently in Pain? Yes   Pain Score 2    Pain Location Shoulder   Pain Orientation Left   Pain Type Acute pain   Multiple Pain Sites No      Today Treatment:  Therex: NuStep: level 1, 5 min  B scalenes stretch x 30 sec  Seated scapular retraction 3" 2 x 15 reps  Seated scapular retraction with green TB x 6 reps; terminated due to pt. report of L shoulder pain Supine B shoulder flexion with wand x 15 reps Seated AAROM L ER x 15 reps  Standing L shoulder flexion AAROM x 2 min Standing L shoulder scaption AAROM x 2 min   Verbal HEP review    Postural education: Sitting posture review       PT Short Term Goals - 02/27/16 1242    PT SHORT TERM GOAL #1   Title independent with HEP (03/25/16)   Time 4   Period Weeks   Status On-going   PT SHORT TERM GOAL #2   Title improve L shoulder AROM by 15 degrees for improved mobility and function (03/25/16)   Time 4   Period Weeks   Status On-going   PT SHORT TERM GOAL #3   Title improve L grip strength by 5# for improved function (03/25/16)   Time 4   Period Weeks   Status On-going           PT Long Term Goals - 02/27/16 1242    PT LONG TERM GOAL #1   Title verbalize understanding of posture/body mechanics to decrease risk of reinjury (04/22/16)   Time 8   Period Weeks   Status On-going   PT LONG TERM GOAL #2   Title improve L shoulder AROM to WNL for improved function and mobility (04/22/16)   Time 8   Period Weeks   Status On-going   PT LONG TERM GOAL #3   Title improve LLE strength to 4/5 for improved function and mobiilty (04/22/16)   Time 8   Period Weeks   Status On-going   PT LONG TERM GOAL #4   Title improve L grip strength to 8# for improved function (04/22/16)   Time 8   Period Weeks   Status On-going   PT LONG TERM GOAL #5   Title report ability to amb >  20 min without increase in pain for improved function (04/22/16)   Time 8   Period Weeks   Status On-going               Plan - 03/25/16 1514    Clinical Impression Statement Pt. pain free in the low back today however with 2/10 L shoulder pain initially.  Today's treatment focused on posture education and conservative scapular strengthening activity with pt. tolerating all well; conservative L shoulder ROM activities introduced today with pt. tolerating well.  Pt. required frequent verbal cues to stay on task and perform activities with proper technqiue today.  Pt. adament today that all LE / hip stretching performed two treatments ago has caused her ongoing pain in the low back; pt. reports that LBP is gone today and refuses to attemp LE stretching in treatment.  Pt. with poor tolerance for any exerting activities however able to ambulate/maneuver throughout treatment in a manner not consistent with pain.     PT Treatment/Interventions ADLs/Self Care Home Management;Cryotherapy;Electrical Stimulation;Iontophoresis 4mg /ml  Dexamethasone;Moist Heat;Patient/family education;Neuromuscular re-education;Therapeutic exercise;Therapeutic activities;Functional mobility training;Stair training;Gait training;Vasopneumatic Device;Passive range of motion;Dry needling;Taping   PT Next Visit Plan try mid range shoulder exercises; posture education and exercises; focus on neck/shoulder ROM and scap strengthening - progress to lumbar exercises as tolerated; estim/TENS      Patient will benefit from skilled therapeutic intervention in order to improve the following deficits and impairments:  Decreased strength, Decreased range of motion, Pain, Impaired perceived functional ability, Postural dysfunction, Decreased activity tolerance, Decreased mobility, Impaired UE functional use  Visit Diagnosis: Abnormal posture  Muscle weakness (generalized)  Left-sided low back pain without sciatica  Pain in left shoulder  Stiffness of left shoulder, not elsewhere classified     Problem List There are no active problems to display for this patient.   Kermit BaloMicah Elfego Giammarino, PTA 03/25/2016, 6:07 PM  [facility] [place] [address]  [address] [place], [place], [address] Phone: [phone]   Fax:  [phone]  Name: Vickie Jefferson MRN: [id number] Date of Birth: 05/10/1951

## 2016-03-28 ENCOUNTER — Ambulatory Visit: Payer: Medicare Other

## 2016-04-02 ENCOUNTER — Ambulatory Visit: Payer: Medicare Other | Attending: Sports Medicine

## 2016-04-02 DIAGNOSIS — M6281 Muscle weakness (generalized): Secondary | ICD-10-CM | POA: Diagnosis present

## 2016-04-02 DIAGNOSIS — M25512 Pain in left shoulder: Secondary | ICD-10-CM

## 2016-04-02 DIAGNOSIS — R293 Abnormal posture: Secondary | ICD-10-CM | POA: Insufficient documentation

## 2016-04-02 DIAGNOSIS — M545 Low back pain, unspecified: Secondary | ICD-10-CM

## 2016-04-02 NOTE — Therapy (Signed)
[facility] [place] [address]  [address] [place], [place], [address] Phone: [phone]   Fax:  [phone]  Physical Therapy Treatment  Patient Details  Name: Vickie FellsMarcy Jefferson MRN: [id number] Date of Birth: 12/22/1950 Referring Provider: Delfin GantAdam S Kendall  Encounter Date: 04/02/2016      PT End of Session - 04/02/16 1540    Visit Number 6   Number of Visits 16   Date for PT Re-Evaluation 04/22/16   PT Start Time 1457   PT Stop Time 1542   PT Time Calculation (min) 45 min   Activity Tolerance Patient tolerated treatment well   Behavior [facility] Therapy [facility] for tasks assessed/performed      Past Medical History  Diagnosis Date  . Thyroid disease   . Migraines   . Fibromyalgia     History reviewed. No pertinent past surgical history.  There were no vitals filed for this visit.      Subjective Assessment - 04/02/16 1501    Subjective Pt. reports the L shoulder pain is a 4/10 currently; pt. currently has a 7/10 headache; Pt. reports L anterior knee pain is a 3/10.     Patient Stated Goals improve posture, pain   Currently in Pain? Yes   Pain Score 4    Pain Location Shoulder   Pain Orientation Left   Pain Descriptors / Indicators Aching   Pain Type Acute pain   Multiple Pain Sites Yes   Pain Score 7   Pain Location Head   Pain Orientation Anterior   Pain Descriptors / Indicators Aching   Pain Type Acute pain   Pain Onset Today   Pain Score 3   Pain Location Knee   Pain Orientation Left   Pain Descriptors / Indicators Aching   Pain Onset More than a month ago      Today's treatment:  Therex:  NuStep: level 1, 6 min  L shoulder AAROM flexion x 10 reps with (75cm) p-ball   Manual:  L TPR to L medial scapular region x 2 min with 1000Gr med ball on wall   HEP review: Standing B shoulder horizontal abduction with red TB x 10 reps Standing scapular retraction with red TB x 10 reps  Standing B shoulder ER with red TB  x 10 reps  Therex: Laying supine on mat: Supine cross stretch x 2 min Supine scapular retraction 5" x 10 reps Supine B horizontal abduction with retraction with blue TB x 10 reps       PT Education - 04/02/16 1559    Education provided Yes   Education Details Red TB isseud to pt. for current HEP          PT Short Term Goals - 04/02/16 1603    PT SHORT TERM GOAL #1   Title independent with HEP (03/25/16)   Time 4   Period Weeks   Status Achieved   PT SHORT TERM GOAL #2   Title improve L shoulder AROM by 15 degrees for improved mobility and function (03/25/16)   Time 4   Period Weeks   Status On-going   PT SHORT TERM GOAL #3   Title improve L grip strength by 5# for improved function (03/25/16)   Time 4   Period Weeks   Status On-going           PT Long Term Goals - 02/27/16 1242    PT LONG TERM GOAL #1   Title verbalize understanding of posture/body  mechanics to decrease risk of reinjury (04/22/16)   Time 8   Period Weeks   Status On-going   PT LONG TERM GOAL #2   Title improve L shoulder AROM to WNL for improved function and mobility (04/22/16)   Time 8   Period Weeks   Status On-going   PT LONG TERM GOAL #3   Title improve LLE strength to 4/5 for improved function and mobiilty (04/22/16)   Time 8   Period Weeks   Status On-going   PT LONG TERM GOAL #4   Title improve L grip strength to 8# for improved function (04/22/16)   Time 8   Period Weeks   Status On-going   PT LONG TERM GOAL #5   Title report ability to amb > 20 min without increase in pain for improved function (04/22/16)   Time 8   Period Weeks   Status On-going               Plan - 04/02/16 1505    Clinical Impression Statement Pt. reports the L shoulder pain is a 4/10 currently; pt. currently has a 7/10 headache; Pt. reports L anterior knee pain is a 3/10.  Today's treatment focused on HEP review and issue of red TB for current HEP activities; pt. demo'd good technique and  understanding of all conservative scapular and shoulder strengthening activities; pt. still with very limited tolerance for advancement of shoulder strengthening activity with frequent reports of pain; pt.'s UE/LE movements throughout treatment are inconsistent with pain reports; pt. moves freely without demonstrating any sign of muscular guarding despite multiple location pain reports throughout therex.  Pt. continues to refuse gentle LE / hip stretching activities despite explanation of rationale and need for LE flexibility from therapist.           PT Treatment/Interventions ADLs/Self Care Home Management;Cryotherapy;Electrical Stimulation;Iontophoresis 4mg /ml Dexamethasone;Moist Heat;Patient/family education;Neuromuscular re-education;Therapeutic exercise;Therapeutic activities;Functional mobility training;Stair training;Gait training;Vasopneumatic Device;Passive range of motion;Dry needling;Taping   PT Next Visit Plan try mid range shoulder exercises; posture education and exercises; focus on neck/shoulder ROM and scap strengthening - progress to lumbar exercises as tolerated; estim/TENS      Patient will benefit from skilled therapeutic intervention in order to improve the following deficits and impairments:  Decreased strength, Decreased range of motion, Pain, Impaired perceived functional ability, Postural dysfunction, Decreased activity tolerance, Decreased mobility, Impaired UE functional use  Visit Diagnosis: Abnormal posture  Muscle weakness (generalized)  Left-sided low back pain without sciatica  Pain in left shoulder     Problem List There are no active problems to display for this patient.   Kermit BaloMicah Adaja Wander, PTA 04/02/2016, 4:15 PM  [facility] [place] [address]  [address] [place], [place], [address] Phone: [phone]   Fax:  [phone]  Name: Vickie FellsMarcy Jefferson MRN: [id number] Date of Birth: 06/09/1951

## 2016-04-04 ENCOUNTER — Ambulatory Visit: Payer: Medicare Other

## 2016-04-11 ENCOUNTER — Ambulatory Visit: Payer: Medicare Other

## 2016-04-11 DIAGNOSIS — M25512 Pain in left shoulder: Secondary | ICD-10-CM

## 2016-04-11 DIAGNOSIS — R293 Abnormal posture: Secondary | ICD-10-CM

## 2016-04-11 DIAGNOSIS — M545 Low back pain, unspecified: Secondary | ICD-10-CM

## 2016-04-11 DIAGNOSIS — M6281 Muscle weakness (generalized): Secondary | ICD-10-CM

## 2016-04-11 NOTE — Therapy (Addendum)
[facility] [address]  [address] [place], [place], [address] Phone: [phone]   Fax:  [phone]  Physical Therapy Treatment  Patient Details  Name: Vickie Jefferson MRN: [id number] Date of Birth: 12/02/1950 Referring Provider: Delfin Gant  Encounter Date: 04/11/2016      PT End of Session - 04/11/16 1216    Visit Number 7   Number of Visits 16   Date for PT Re-Evaluation 04/22/16   PT Start Time 1102   PT Stop Time 1150   PT Time Calculation (min) 48 min   Activity Tolerance Patient tolerated treatment well   Behavior During Therapy [facility] for tasks assessed/performed      Past Medical History  Diagnosis Date  . Thyroid disease   . Migraines   . Fibromyalgia     No past surgical history on file.  There were no vitals filed for this visit.      Subjective Assessment - 04/11/16 1108    Subjective Pt. reports a 3/10 L sided back pain and posterior heachache pain.  No other pain or complaints today.     Patient Stated Goals improve posture, pain   Currently in Pain? Yes   Pain Score 3    Pain Location Shoulder   Pain Orientation Left   Pain Descriptors / Indicators Aching   Pain Type Acute pain   Pain Onset In the past 7 days   Multiple Pain Sites Yes   Pain Score 3   Pain Location Head   Pain Orientation Left   Pain Descriptors / Indicators Aching   Pain Type Acute pain   Pain Onset In the past 7 days      Today's treatment:  Therex:  NuStep: level 2, 6 min  L shoulder AAROM flexion x 10 reps with (75cm) p-ball  Standing scapular retraction with green TB x 10 reps  Standing L shoulder ER/IR  with  TB x 10 reps Seated L shoulder AAROM flexion on red p-ball (75cm) x 10 reps   Manual:  PROM L shoulder all directions  R sidelying L ER with manual facilitation to L scapular x 10 reps   Therex: Laying supine on mat:        B shoulder protraction (no wieght) x 15 reps        L shoulder circles  CW, CCW x 15 reps each way         PT Short Term Goals - 04/02/16 1603    PT SHORT TERM GOAL #1   Title independent with HEP (03/25/16)   Time 4   Period Weeks   Status Achieved   PT SHORT TERM GOAL #2   Title improve L shoulder AROM by 15 degrees for improved mobility and function (03/25/16)   Time 4   Period Weeks   Status On-going   PT SHORT TERM GOAL #3   Title improve L grip strength by 5# for improved function (03/25/16)   Time 4   Period Weeks   Status On-going           PT Long Term Goals - 02/27/16 1242    PT LONG TERM GOAL #1   Title verbalize understanding of posture/body mechanics to decrease risk of reinjury (04/22/16)   Time 8   Period Weeks   Status On-going   PT LONG TERM GOAL #2   Title improve L shoulder AROM to WNL for improved function and mobility (04/22/16)   Time 8  Period Weeks   Status On-going   PT LONG TERM GOAL #3   Title improve LLE strength to 4/5 for improved function and mobiilty (04/22/16)   Time 8   Period Weeks   Status On-going   PT LONG TERM GOAL #4   Title improve L grip strength to 8# for improved function (04/22/16)   Time 8   Period Weeks   Status On-going   PT LONG TERM GOAL #5   Title report ability to amb > 20 min without increase in pain for improved function (04/22/16)   Time 8   Period Weeks   Status On-going               Plan - 04/11/16 1217    Clinical Impression Statement Pt. reports a 3/10 L sided back pain and posterior heachache pain.  No other pain or complaints today.  Pt. tolerated all mid range L shoulder AROM activities, and conservative scapular strengthening activities well today without L shoulder pain increase from above basline 3/10.  Pt. continues to report L shoulder pain intermittently throughout therex however actions and movements at L shoulder throughout therex are inconsistent with pain reports.  Pt. continues to refuse LE / hip stretching activities and is easily distractable, frequently  losing focus during shoulder activities.  Pt. continues to be very self-limiting with treatment, refusing certain therex activities frequently opting for "easy", "interesting" therex despite stating she is "ready to make progress" in treatment.        PT Treatment/Interventions ADLs/Self Care Home Management;Cryotherapy;Electrical Stimulation;Iontophoresis 4mg /ml Dexamethasone;Moist Heat;Patient/family education;Neuromuscular re-education;Therapeutic exercise;Therapeutic activities;Functional mobility training;Stair training;Gait training;Vasopneumatic Device;Passive range of motion;Dry needling;Taping   PT Next Visit Plan try mid range shoulder exercises; posture education and exercises; focus on neck/shoulder ROM and scap strengthening - progress to lumbar exercises as tolerated; estim/TENS      Patient will benefit from skilled therapeutic intervention in order to improve the following deficits and impairments:  Decreased strength, Decreased range of motion, Pain, Impaired perceived functional ability, Postural dysfunction, Decreased activity tolerance, Decreased mobility, Impaired UE functional use  Visit Diagnosis: Abnormal posture  Muscle weakness (generalized)  Left-sided low back pain without sciatica  Pain in left shoulder     Problem List There are no active problems to display for this patient.   Kermit BaloMicah Ciena Sampley, PTA 04/11/2016, 12:33 PM  [facility] [place] [address]  [address] [place], [place], [address] Phone: [phone]   Fax:  [phone]  Name: Vickie Jefferson MRN: [id number] Date of Birth: 08/21/1951

## 2016-04-17 ENCOUNTER — Ambulatory Visit: Payer: Medicare Other
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
## 2016-05-06 ENCOUNTER — Ambulatory Visit: Payer: Medicare Other | Attending: Sports Medicine

## 2016-05-06 ENCOUNTER — Encounter: Payer: Self-pay | Admitting: Physical Therapy

## 2016-05-06 DIAGNOSIS — M545 Low back pain, unspecified: Secondary | ICD-10-CM

## 2016-05-06 DIAGNOSIS — M25512 Pain in left shoulder: Secondary | ICD-10-CM

## 2016-05-06 DIAGNOSIS — M6281 Muscle weakness (generalized): Secondary | ICD-10-CM | POA: Insufficient documentation

## 2016-05-06 DIAGNOSIS — R293 Abnormal posture: Secondary | ICD-10-CM

## 2016-05-06 DIAGNOSIS — M25612 Stiffness of left shoulder, not elsewhere classified: Secondary | ICD-10-CM

## 2016-05-06 NOTE — Therapy (Signed)
[facility] [place] [address]  [place], [place], [address] Phone: [phone]   Fax:  [phone]  Physical Therapy Treatment  Patient Details  Name: Vickie Jefferson MRN: [id number] Date of Birth: 1951-07-22 Referring Provider: Pedro Earls  Encounter Date: 05/06/2016      PT End of Session - 05/06/16 1545    Visit Number 8   Number of Visits 16   Date for PT Re-Evaluation 04/22/16   PT Start Time 0626   PT Stop Time 1617   PT Time Calculation (min) 39 min   Activity Tolerance Patient tolerated treatment well   Behavior During Therapy [facility] for tasks assessed/performed      Past Medical History:  Diagnosis Date  . Fibromyalgia   . Migraines   . Thyroid disease     No past surgical history on file.  There were no vitals filed for this visit.      Subjective Assessment - 05/06/16 1544    Subjective Pt. pain free initially today.  pt. returning to PT after 3 week break and reports she feels that she is stronger.     Patient Stated Goals improve posture, pain   Currently in Pain? No/denies   Pain Score 0-No pain   Multiple Pain Sites No            [facility] PT Assessment - 05/06/16 1604      Strength   Right Shoulder Flexion 4/5   Right Shoulder ABduction 4/5   Left Shoulder Flexion 4/5   Left Shoulder ABduction 4/5   Right/Left Elbow Right;Left   Right Elbow Flexion 4+/5   Right Elbow Extension 4-/5   Left Elbow Flexion 4+/5   Left Elbow Extension 3+/5   Right Hand Grip (lbs) 23   Left Hand Grip (lbs) 17   Right Hip Flexion 4/5   Right Hip Extension 4/5   Right Hip ABduction 4/5   Right Hip ADduction 4/5   Left Hip Flexion 4-/5   Left Hip Extension 4/5   Left Hip ABduction 4/5   Left Hip ADduction 3+/5   Right/Left Knee Right;Left   Right Knee Flexion --  Pt. refused to test    Right Knee Extension --  pt. declined to test   Left Knee Flexion 4-/5   Left Knee Extension 4+/5   Right Ankle  Dorsiflexion 5/5   Left Ankle Dorsiflexion 4+/5          Today's treatment:  * pt. required increased time for all testing today due to distractibility and non-cooperation with therapist   Therex: NuStep: level 4, 5 min    MMT testing   ROM testing   Goal Testing          PT Short Term Goals - 05/07/16 1418      PT SHORT TERM GOAL #1   Title independent with HEP (03/25/16)   Time 4   Period Weeks   Status Achieved     PT SHORT TERM GOAL #2   Title improve L shoulder AROM by 15 degrees for improved mobility and function (03/25/16)   Time 4   Period Weeks   Status Partially Met  pt. able to improve L shoulder abduction by >15 dg however with only a 10 dg improvement in flexion to 110 dg      PT SHORT TERM GOAL #3   Title improve L grip strength by 5# for improved function (03/25/16)   Time 4  Period Weeks   Status Achieved           PT Long Term Goals - 05/06/16 1548      PT LONG TERM GOAL #1   Title verbalize understanding of posture/body mechanics to decrease risk of reinjury (04/22/16)   Time 8   Period Weeks   Status On-going     PT LONG TERM GOAL #2   Title improve L shoulder AROM to WNL for improved function and mobility (04/22/16)   Time 8   Period Weeks   Status On-going  8.9.17: not met; flexion 110 dg, abd 116 dg .     PT LONG TERM GOAL #3   Title improve LLE strength to 4/5 for improved function and mobiilty (04/22/16)   Time 8   Period Weeks   Status Partially Met  8.9.17: met for all LLE strength except L hip adduction, flexion, and L knee flexion.     PT LONG TERM GOAL #4   Title improve L grip strength to 8# for improved function (04/22/16)   Time 8   Period Weeks   Status Achieved     PT LONG TERM GOAL #5   Title report ability to amb > 20 min without increase in pain for improved function (04/22/16)   Time 8   Period Weeks   Status On-going  05/06/16: pt. reports she still has knee pain after walking for 5 min.                  Plan - 05/06/16 1546    Clinical Impression Statement Pt. pain free initially today.  pt. returning to PT after 3 week break and reports she feels that she is stronger.  LTG's addressed today with pt. demonstrating limited progress since last treatment.  Pt. met all LLE strength goals except L hip adduction, flexion, and L knee flexion Today.  Pt. L shoulder AROM still very limited at 110 flexion and 116 abduction not WFL.  Pt. still reports L knee pain after walking for 5 min at this point.  Pt. to see PT on 8/15.     PT Treatment/Interventions ADLs/Self Care Home Management;Cryotherapy;Electrical Stimulation;Iontophoresis 52m/ml Dexamethasone;Moist Heat;Patient/family education;Neuromuscular re-education;Therapeutic exercise;Therapeutic activities;Functional mobility training;Stair training;Gait training;Vasopneumatic Device;Passive range of motion;Dry needling;Taping   PT Next Visit Plan try mid range shoulder exercises; posture education and exercises; focus on neck/shoulder ROM and scap strengthening - progress to lumbar exercises as tolerated; estim/TENS      Patient will benefit from skilled therapeutic intervention in order to improve the following deficits and impairments:  Decreased strength, Decreased range of motion, Pain, Impaired perceived functional ability, Postural dysfunction, Decreased activity tolerance, Decreased mobility, Impaired UE functional use  Visit Diagnosis: Abnormal posture  Muscle weakness (generalized)  Left-sided low back pain without sciatica  Pain in left shoulder  Stiffness of left shoulder, not elsewhere classified     Problem List There are no active problems to display for this patient.   MBess Harvest PTA 05/07/2016, 2:36 PM  [facility] [place] [address]Phone: [phone]  Fax:  [phone] Name: MRainna NearhoodMRN: [id number] of  Birth: [birth year]

## 2016-05-08 NOTE — Therapy (Signed)
[facility] [address]  [place], [place], [address] Phone: [phone]   Fax:  [phone]  Physical Therapy Recertification  Patient Details  Name: Vickie Jefferson MRN: [id number] Date of Birth: 1950-12-08 Referring Provider: Pedro Earls  Encounter Date: 05/06/2016      PT End of Session - 05/08/16 0747    Number of Visits 16   Date for PT Re-Evaluation 06/03/16      Past Medical History:  Diagnosis Date  . Fibromyalgia   . Migraines   . Thyroid disease     No past surgical history on file.  There were no vitals filed for this visit.          [facility] PT Assessment - 05/08/16 0758      AROM   Left Shoulder Flexion 110 Degrees   Left Shoulder ABduction 116 Degrees     Strength   Right Shoulder Flexion 4/5   Right Shoulder ABduction 4/5   Left Shoulder Flexion 4/5   Left Shoulder ABduction 4/5   Right Elbow Flexion 4+/5   Right Elbow Extension 4-/5   Left Elbow Flexion 4+/5   Left Elbow Extension 3+/5   Right Hand Grip (lbs) 23   Left Hand Grip (lbs) 17   Right Hip Flexion 4/5   Right Hip Extension 4/5   Right Hip ABduction 4/5   Right Hip ADduction 4/5   Left Hip Flexion 4-/5   Left Hip Extension 4/5   Left Hip ABduction 4/5   Left Hip ADduction 3+/5   Right Knee Flexion --  Pt. refused to test    Right Knee Extension --  pt. declined to test   Left Knee Flexion 4-/5   Left Knee Extension 4+/5   Right Ankle Dorsiflexion 5/5   Left Ankle Dorsiflexion 4+/5                               PT Short Term Goals - 05/07/16 1418      PT SHORT TERM GOAL #1   Title independent with HEP (03/25/16)   Time 4   Period Weeks   Status Achieved     PT SHORT TERM GOAL #2   Title improve L shoulder AROM by 15 degrees for improved mobility and function (03/25/16)   Time 4   Period Weeks   Status Partially Met  pt. able to improve L shoulder abduction by >15 dg however with  only a 10 dg improvement in flexion to 110 dg      PT SHORT TERM GOAL #3   Title improve L grip strength by 5# for improved function (03/25/16)   Time 4   Period Weeks   Status Achieved           PT Long Term Goals - 05/08/16 0747      PT LONG TERM GOAL #1   Title verbalize understanding of posture/body mechanics to decrease risk of reinjury (06/03/16)   Time 4   Period Weeks   Status On-going     PT LONG TERM GOAL #2   Title improve L shoulder AROM to WNL for improved function and mobility (06/03/16)   Status On-going     PT LONG TERM GOAL #3   Title improve LLE strength to 4/5 for improved function and mobiilty (04/22/16)   Baseline revised into new goal - see below   Status Partially Met  PT LONG TERM GOAL #4   Title improve L grip strength to 8# for improved function (04/22/16)   Status Achieved     PT LONG TERM GOAL #5   Title report ability to amb > 20 min without increase in pain for improved function (06/03/16)     Additional Long Term Goals   Additional Long Term Goals Yes     PT LONG TERM GOAL #6   Title improve L hip adduction, flexion and L knee flexion strength to at least 4/5 for improved function (06/03/16)   Time 4   Period Weeks   Status New               Plan - 05/08/16 0751    Clinical Impression Statement Pt inconsistent with OPPT attendance at this point and only able to attend to 8 sessions in 8 weeks.  Pt missed 3 weeks of PT and did not further elaborate as to why she missed sessions.  Overall limited progress has been made towards LTGs.  This is in part to limited attendance as well as limited participation during sessions.  Pt declines many exercises in clinic and therefore making it difficult for therapist and assistant to progress pt.  Pt also reports high pain values but demeanor and mood are inconsistent with this.  Pt is always very calm and cheerful during sessions with therapists.  At this time recommend continuing PT 2x/wk x 4 weeks  with the understanding that pt needs to attend consistently to maximize progress.  Will benefit from PT to maximize function as long as pt is consistent and willing to work with PT/PTA.   Rehab Potential Fair   PT Frequency 2x / week   PT Duration 4 weeks   PT Treatment/Interventions ADLs/Self Care Home Management;Cryotherapy;Electrical Stimulation;Iontophoresis 53m/ml Dexamethasone;Moist Heat;Patient/family education;Neuromuscular re-education;Therapeutic exercise;Therapeutic activities;Functional mobility training;Stair training;Gait training;Vasopneumatic Device;Passive range of motion;Dry needling;Taping   PT Next Visit Plan try mid range shoulder exercises; posture education and exercises; focus on neck/shoulder ROM and scap strengthening - progress to lumbar exercises as tolerated; estim/TENS   Consulted and Agree with Plan of Care Patient      Patient will benefit from skilled therapeutic intervention in order to improve the following deficits and impairments:  Decreased strength, Decreased range of motion, Pain, Impaired perceived functional ability, Postural dysfunction, Decreased activity tolerance, Decreased mobility, Impaired UE functional use  Visit Diagnosis: Abnormal posture - Plan: PT plan of care cert/re-cert  Muscle weakness (generalized) - Plan: PT plan of care cert/re-cert  Left-sided low back pain without sciatica - Plan: PT plan of care cert/re-cert  Pain in left shoulder - Plan: PT plan of care cert/re-cert  Stiffness of left shoulder, not elsewhere classified - Plan: PT plan of care cert/re-cert     Problem List There are no active problems to display for this patient.      SLaureen Abrahams PT, DPT 05/08/16 8:00 AM    [facility] [place] [address]Phone: [phone]  Fax:  [phone] Name: MVidel NobregaMRN: [id number] of Birth: [birth year]

## 2016-05-13 ENCOUNTER — Ambulatory Visit: Payer: Medicare Other | Admitting: Physical Therapy

## 2016-05-13 DIAGNOSIS — M545 Low back pain, unspecified: Secondary | ICD-10-CM

## 2016-05-13 DIAGNOSIS — R293 Abnormal posture: Secondary | ICD-10-CM

## 2016-05-13 DIAGNOSIS — M6281 Muscle weakness (generalized): Secondary | ICD-10-CM

## 2016-05-13 DIAGNOSIS — M25512 Pain in left shoulder: Secondary | ICD-10-CM

## 2016-05-13 DIAGNOSIS — M25612 Stiffness of left shoulder, not elsewhere classified: Secondary | ICD-10-CM

## 2016-05-13 NOTE — Patient Instructions (Addendum)
Pectoralis Stretch: Supine (Towel Roll)    Lie with rolled towel under spine, letting shoulders relax toward floor. Lie _2-3__ minutes. Do _1-2__ times per day.  http://ss.exer.us/355   Copyright  VHI. All rights reserved.    Resisted Horizontal Abduction: Bilateral    Lie on back, tubing in both hands, arms out in front. Keeping arms straight, pinch shoulder blades together and stretch arms out. Repeat __15__ times per set. Do __1__ sets per session. Do _1-2___ sessions per day.  http://orth.exer.us/969   Copyright  VHI. All rights reserved.   Resisted External Rotation: in Neutral - Bilateral    Lie on your back, tubing in both hands, elbows at sides, bent to 90, forearms forward. Pinch shoulder blades together and rotate forearms out. Keep elbows at sides. Repeat _15___ times per set. Do __1__ sets per session. Do _1-2___ sessions per day.  http://orth.exer.us/967   Copyright  VHI. All rights reserved.    Strengthening: Resisted Flexion    Lie on your back for this exercise. Hold tubing with left arm at side. Pull forward and up. Move shoulder through pain-free range of motion. Repeat __15__ times per set. Do _1___ sets per session. Do __1-2__ sessions per day.  http://orth.exer.us/825   Copyright  VHI. All rights reserved.    Stop these if you experience any pain.    Straight Leg Raise    Slowly raise locked left leg _6-8___ inches from floor.  Repeat with right leg as pain allows. Repeat __15__ times per set. Do __1__ sets per session. Do __1-2__ sessions per day.  http://orth.exer.us/1103   Copyright  VHI. All rights reserved.   KNEE: Extension, Short Arc Quads - Supine    Place bolster under knees. Raise one leg until knee is straight. __15_ reps per set, _1-2__ sets per day.   Copyright  VHI. All rights reserved.    Bridging    Slowly raise buttocks from floor, hold for 5 seconds. Repeat __15__ times per set. Do __1__ sets  per session. Do _1-2___ sessions per day.  http://orth.exer.us/1097   Copyright  VHI. All rights reserved.     Strengthening: Hip Abductor - Resisted    With band looped around both legs above knees, push thighs apart. Repeat _15___ times per set. Do _1___ sets per session. Do _1-2___ sessions per day.  http://orth.exer.us/689   Copyright  VHI. All rights reserved.

## 2016-05-13 NOTE — Therapy (Signed)
[facility] [address]  [place], [place], [address] Phone: [phone]   Fax:  [phone]  Physical Therapy Treatment  Patient Details  Name: Vickie Jefferson MRN: [id number] Date of Birth: September 03, 1951 Referring Provider: Pedro Earls  Encounter Date: 05/13/2016      PT End of Session - 05/13/16 1614    Visit Number 9   Number of Visits 16   Date for PT Re-Evaluation 06/03/16   PT Start Time 1532   PT Stop Time 1613   PT Time Calculation (min) 41 min   Activity Tolerance Patient tolerated treatment well;Patient limited by pain   Behavior During Therapy [facility] for tasks assessed/performed      Past Medical History:  Diagnosis Date  . Fibromyalgia   . Migraines   . Thyroid disease     No past surgical history on file.  There were no vitals filed for this visit.      Subjective Assessment - 05/13/16 1535    Subjective "pretty darn good."  when asked how she was doing.  unrelated to PT pulled muscle across L side of back   Pertinent History fibromyalgia, migraines, hypothroidism   Limitations Sitting;Standing;Walking;House hold activities;Lifting   How long can you sit comfortably? depends on where she's sitting: modified to accommodate her - 15-30 min (reports she had a huge headache after sitting for this amount of time at MD's office)   How long can you stand comfortably? 15-20 min   How long can you walk comfortably? 5 min (previously 10 min)   Diagnostic tests xrays or MRI - pt unsure of specific imaging or body parts   Patient Stated Goals improve posture, pain   Currently in Pain? No/denies                         [facility] Adult PT Treatment/Exercise - 05/13/16 1540      Exercises   Exercises Lumbar;Knee/Hip     Knee/Hip Exercises: Aerobic   Nustep NuStep L 6 x 6 min     Knee/Hip Exercises: Supine   Short Arc Quad Sets Both;15 reps   Bridges Both;15 reps   Straight Leg Raises  Left;15 reps   Other Supine Knee/Hip Exercises clamshell with red theraband x 15     Shoulder Exercises: Supine   Horizontal ABduction Both;15 reps;Theraband   Theraband Level (Shoulder Horizontal ABduction) Level 2 (Red)   External Rotation Both;15 reps;Theraband   Theraband Level (Shoulder External Rotation) Level 2 (Red)   Flexion Right;15 reps;Theraband   Theraband Level (Shoulder Flexion) Level 2 (Red)   Flexion Limitations attempted LUE but pt reports increased pain so stopped after 3 reps                PT Education - 05/13/16 1614    Education provided Yes   Education Details HEP   Person(s) Educated Patient   Methods Explanation;Demonstration;Handout   Comprehension Verbalized understanding;Returned demonstration;Need further instruction          PT Short Term Goals - 05/07/16 1418      PT SHORT TERM GOAL #1   Title independent with HEP (03/25/16)   Time 4   Period Weeks   Status Achieved     PT SHORT TERM GOAL #2   Title improve L shoulder AROM by 15 degrees for improved mobility and function (03/25/16)   Time 4   Period Weeks   Status Partially Met  pt.  able to improve L shoulder abduction by >15 dg however with only a 10 dg improvement in flexion to 110 dg      PT SHORT TERM GOAL #3   Title improve L grip strength by 5# for improved function (03/25/16)   Time 4   Period Weeks   Status Achieved           PT Long Term Goals - 05/08/16 0747      PT LONG TERM GOAL #1   Title verbalize understanding of posture/body mechanics to decrease risk of reinjury (06/03/16)   Time 4   Period Weeks   Status On-going     PT LONG TERM GOAL #2   Title improve L shoulder AROM to WNL for improved function and mobility (06/03/16)   Status On-going     PT LONG TERM GOAL #3   Title improve LLE strength to 4/5 for improved function and mobiilty (04/22/16)   Baseline revised into new goal - see below   Status Partially Met     PT LONG TERM GOAL #4   Title  improve L grip strength to 8# for improved function (04/22/16)   Status Achieved     PT LONG TERM GOAL #5   Title report ability to amb > 20 min without increase in pain for improved function (06/03/16)     Additional Long Term Goals   Additional Long Term Goals Yes     PT LONG TERM GOAL #6   Title improve L hip adduction, flexion and L knee flexion strength to at least 4/5 for improved function (06/03/16)   Time 4   Period Weeks   Status New               Plan - 05/13/16 1614    Clinical Impression Statement Pt reports increased pain with er and flexion activities in L shoulder.  Exercises ceased once pt reported pain.  Issued HEP for UE and LE strengthening.  Decreasing frequency to 1x/wk per pt request.  Plan to continue in preparation for transition to HEP.  Pt plans to follow up with MD about continued L shoulder pain.   PT Treatment/Interventions ADLs/Self Care Home Management;Cryotherapy;Electrical Stimulation;Iontophoresis 71m/ml Dexamethasone;Moist Heat;Patient/family education;Neuromuscular re-education;Therapeutic exercise;Therapeutic activities;Functional mobility training;Stair training;Gait training;Vasopneumatic Device;Passive range of motion;Dry needling;Taping   PT Next Visit Plan try mid range shoulder exercises; posture education and exercises; focus on neck/shoulder ROM and scap strengthening - progress to lumbar exercises as tolerated; estim/TENS      Patient will benefit from skilled therapeutic intervention in order to improve the following deficits and impairments:  Decreased strength, Decreased range of motion, Pain, Impaired perceived functional ability, Postural dysfunction, Decreased activity tolerance, Decreased mobility, Impaired UE functional use  Visit Diagnosis: Abnormal posture  Muscle weakness (generalized)  Left-sided low back pain without sciatica  Pain in left shoulder  Stiffness of left shoulder, not elsewhere classified     Problem  List There are no active problems to display for this patient.      SLaureen Abrahams PT, DPT 05/13/16 4:16 PM    [facility] [place] [address]Phone: [phone]  Fax:  [phone] Name: MGraceland WachterMRN: [id number] of Birth: [birth year]

## 2016-05-21 ENCOUNTER — Ambulatory Visit: Payer: Medicare Other

## 2016-05-27 ENCOUNTER — Ambulatory Visit: Payer: Medicare Other

## 2016-05-28 ENCOUNTER — Ambulatory Visit: Payer: Medicare Other | Admitting: Physical Therapy

## 2016-05-28 DIAGNOSIS — R293 Abnormal posture: Secondary | ICD-10-CM | POA: Diagnosis not present

## 2016-05-28 DIAGNOSIS — M545 Low back pain, unspecified: Secondary | ICD-10-CM

## 2016-05-28 DIAGNOSIS — M25512 Pain in left shoulder: Secondary | ICD-10-CM

## 2016-05-28 DIAGNOSIS — M6281 Muscle weakness (generalized): Secondary | ICD-10-CM

## 2016-05-28 DIAGNOSIS — M25612 Stiffness of left shoulder, not elsewhere classified: Secondary | ICD-10-CM

## 2016-05-28 NOTE — Patient Instructions (Signed)

## 2016-05-28 NOTE — Therapy (Signed)
[facility] [address]  [place], [place], [address] Phone: [phone]   Fax:  [phone]  Physical Therapy Treatment  Patient Details  Name: Vickie Jefferson MRN: [id number] Date of Birth: Jul 23, 1951 Referring Provider: Pedro Earls  Encounter Date: 05/28/2016      PT End of Session - 05/28/16 1525    Visit Number 10   PT Start Time 7858   PT Stop Time 1525   PT Time Calculation (min) 38 min   Activity Tolerance Patient tolerated treatment well;Patient limited by pain   Behavior During Therapy [facility] for tasks assessed/performed      Past Medical History:  Diagnosis Date  . Fibromyalgia   . Migraines   . Thyroid disease     No past surgical history on file.  There were no vitals filed for this visit.      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 10 min   Diagnostic tests xrays or MRI - pt unsure of specific imaging or body parts   Patient Stated Goals improve posture, pain   Pain Score 0-No pain   Pain Score 3   Pain Location Hip   Pain Orientation Right   Pain Descriptors / Indicators Aching   Pain Type Chronic pain   Pain Frequency Constant            [facility] PT Assessment - 05/28/16 1457      Observation/Other Assessments   Focus on Therapeutic Outcomes (FOTO)  57 (43% limited)     AROM   Left Shoulder Flexion 110 Degrees  119 after discussing progress   Left Shoulder ABduction 92 Degrees     Strength   Left Hip Flexion 4/5   Left Hip ADduction 3+/5   Left Knee Flexion 4/5                     [facility] Adult PT Treatment/Exercise - 05/28/16 1452      Knee/Hip  Exercises: Aerobic   Nustep L5 x 5 min                PT Education - 05/28/16 1525    Education provided Yes   Education Details posture/body mechanics, recommendations to continue HEP   Person(s) Educated Patient   Methods Explanation;Handout   Comprehension Verbalized understanding          PT Short Term Goals - 05/07/16 1418      PT SHORT TERM GOAL #1   Title independent with HEP (03/25/16)   Time 4   Period Weeks   Status Achieved     PT SHORT TERM GOAL #2   Title improve L shoulder AROM by 15 degrees for improved mobility and function (03/25/16)   Time 4   Period Weeks   Status Partially Met  pt. able to improve L shoulder abduction by >15 dg however with only a 10 dg improvement in flexion to 110 dg      PT SHORT TERM GOAL #3   Title improve L grip strength by 5# for improved function (03/25/16)   Time 4   Period Weeks   Status Achieved  PT Long Term Goals - 17-Jun-2016 1526      PT LONG TERM GOAL #1   Title verbalize understanding of posture/body mechanics to decrease risk of reinjury (06/03/16)   Status Achieved     PT LONG TERM GOAL #2   Title improve L shoulder AROM to WNL for improved function and mobility (06/03/16)   Status Not Met     PT LONG TERM GOAL #3   Title improve LLE strength to 4/5 for improved function and mobiilty (04/22/16)   Status Partially Met     PT LONG TERM GOAL #4   Title improve L grip strength to 8# for improved function (04/22/16)   Status Achieved     PT LONG TERM GOAL #5   Title report ability to amb > 20 min without increase in pain for improved function (06/03/16)   Status Not Met     PT LONG TERM GOAL #6   Title improve L hip adduction, flexion and L knee flexion strength to at least 4/5 for improved function (06/03/16)   Status Partially Met               Plan - 2016-06-17 1526    Clinical Impression Statement Pt has only met 2 LTGs and partially met 1 additional LTG.  Pt actually with decreased L  shoulder abduction from initial evaluation.  Limited progress made due to limited attendance and limited participation as pt frequently requesting to modify treatment recommendations.  At this time recommending d/c due to lack of progress.  Pt reports overall pain decreased and feels her function has improved despite goals not reflecting thins.     PT Treatment/Interventions ADLs/Self Care Home Management;Cryotherapy;Electrical Stimulation;Iontophoresis 16m/ml Dexamethasone;Moist Heat;Patient/family education;Neuromuscular re-education;Therapeutic exercise;Therapeutic activities;Functional mobility training;Stair training;Gait training;Vasopneumatic Device;Passive range of motion;Dry needling;Taping   PT Next Visit Plan d/c PT today   Consulted and Agree with Plan of Care Patient      Patient will benefit from skilled therapeutic intervention in order to improve the following deficits and impairments:  Decreased strength, Decreased range of motion, Pain, Impaired perceived functional ability, Postural dysfunction, Decreased activity tolerance, Decreased mobility, Impaired UE functional use  Visit Diagnosis: Abnormal posture  Muscle weakness (generalized)  Left-sided low back pain without sciatica  Pain in left shoulder  Stiffness of left shoulder, not elsewhere classified       G-Codes - [id number]    Functional Assessment Tool Used FOTO 43% limited   Functional Limitation Carrying, moving and handling objects   Carrying, Moving and Handling Objects Goal Status (([id number] At least 20 percent but less than 40 percent impaired, limited or restricted   Carrying, Moving and Handling Objects Discharge Status [phone] At least 20 percent but less than 40 percent impaired, limited or restricted      Problem List There are no active problems to display for this patient.      SLaureen Abrahams PT, DPT 009/19/20173:31 PM    [facility] [address] [place] [address]Phone: [phone]  Fax:  [phone] Name: MCamala TalwarMRN: [id number] of Birth: 1August 24, 1952    PHYSICAL THERAPY DISCHARGE SUMMARY  Visits from Start of Care: 10  Current functional level related to goals / functional outcomes: See above   Remaining deficits: See above; pt's FOTO score decreased by 1% and L shoulder abduction AROM decreased from initial eval.  Pt subjectively reports she feels like she is  doing better despite objective findings indicating otherwise.  Feel pt has maximized rehab potential at this time.   Education / Equipment: HEP, posture/body mechanics  Plan: Patient agrees to discharge.  Patient goals were partially met. Patient is being discharged due to lack of progress. Pt has maximized rehab potential. ?????      Laureen Abrahams, PT, DPT 05/28/16 3:51 PM   [facility] [place],  [address]  [phone] (office) [phone] (fax)

## 2016-07-07 ENCOUNTER — Ambulatory Visit (INDEPENDENT_AMBULATORY_CARE_PROVIDER_SITE_OTHER): Payer: Medicare Other | Admitting: Neurology

## 2016-07-07 ENCOUNTER — Encounter: Payer: Self-pay | Admitting: Neurology

## 2016-07-07 VITALS — BP 133/76 | HR 66 | Ht 66.0 in | Wt 179.0 lb

## 2016-07-07 DIAGNOSIS — R42 Dizziness and giddiness: Secondary | ICD-10-CM

## 2016-07-07 NOTE — Patient Instructions (Signed)
I had a long discussion with the patient with regards to her intermittent symptoms of lightheadedness, nausea of unclear etiology and posterior headaches which seem like muscle tension headaches. I recommend she do regular neck stretching exercises as well as increased bites patient in daily activities for stress relaxation-like regular exercise, swimming, medication and yoga. Check MRI scan the brain with and without contrast as well as EEG. She will return for follow-up in 2 months or call earlier if necessary.
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
## 2016-10-14 ENCOUNTER — Ambulatory Visit: Payer: Medicare Other | Admitting: Neurology

## 2017-08-22 ENCOUNTER — Other Ambulatory Visit (HOSPITAL_BASED_OUTPATIENT_CLINIC_OR_DEPARTMENT_OTHER): Payer: Self-pay | Admitting: Family Medicine

## 2017-08-22 DIAGNOSIS — Z1239 Encounter for other screening for malignant neoplasm of breast: Secondary | ICD-10-CM

## 2017-08-25 ENCOUNTER — Ambulatory Visit (HOSPITAL_BASED_OUTPATIENT_CLINIC_OR_DEPARTMENT_OTHER): Payer: Medicare Other

## 2017-08-26 ENCOUNTER — Ambulatory Visit (HOSPITAL_BASED_OUTPATIENT_CLINIC_OR_DEPARTMENT_OTHER): Payer: Medicare Other

## 2018-07-19 ENCOUNTER — Ambulatory Visit (HOSPITAL_BASED_OUTPATIENT_CLINIC_OR_DEPARTMENT_OTHER)
Admission: RE | Admit: 2018-07-19 | Discharge: 2018-07-19 | Disposition: A | Discharge status: Home / Self Care | Payer: Medicare Other | Source: Ambulatory Visit | Attending: Family Medicine | Admitting: Family Medicine

## 2018-07-19 ENCOUNTER — Encounter (HOSPITAL_BASED_OUTPATIENT_CLINIC_OR_DEPARTMENT_OTHER): Payer: Self-pay

## 2018-07-19 DIAGNOSIS — Z1231 Encounter for screening mammogram for malignant neoplasm of breast: Secondary | ICD-10-CM | POA: Diagnosis not present

## 2018-07-19 DIAGNOSIS — Z1239 Encounter for other screening for malignant neoplasm of breast: Secondary | ICD-10-CM
